# Patient Record
Sex: Male | Born: 1985 | Race: Black or African American | Hispanic: No | Marital: Single | Smoking: Never smoker
Health system: Southern US, Community
[De-identification: ages and names within clinical notes are randomized; demographics above are authoritative.]

---

## 2012-09-18 ENCOUNTER — Emergency Department (HOSPITAL_BASED_OUTPATIENT_CLINIC_OR_DEPARTMENT_OTHER)
Admission: EM | Admit: 2012-09-18 | Discharge: 2012-09-19 | Disposition: A | Discharge status: Home / Self Care | Payer: Self-pay | Attending: Emergency Medicine | Admitting: Emergency Medicine

## 2012-09-18 ENCOUNTER — Emergency Department (HOSPITAL_BASED_OUTPATIENT_CLINIC_OR_DEPARTMENT_OTHER): Payer: Self-pay

## 2012-09-18 ENCOUNTER — Encounter (HOSPITAL_BASED_OUTPATIENT_CLINIC_OR_DEPARTMENT_OTHER): Payer: Self-pay

## 2012-09-18 DIAGNOSIS — R0789 Other chest pain: Secondary | ICD-10-CM | POA: Insufficient documentation

## 2012-09-18 LAB — TROPONIN I: Troponin I: <0.3 ng/mL (ref <0.30)

## 2012-09-18 MED ORDER — ASPIRIN 81 MG PO CHEW
162.0000 mg | CHEWABLE_TABLET | Freq: Once | ORAL | Status: AC
  Administered 2012-09-18: 162 mg via ORAL
  Filled 2012-09-18: qty 2

## 2012-09-18 MED ORDER — ASPIRIN 81 MG PO CHEW: 81.0000 mg | CHEWABLE_TABLET | Freq: Every day | ORAL | Status: DC

## 2012-09-18 NOTE — ED Notes (Signed)
C/o central chest pressure x 2 days with palpitations

## 2012-09-18 NOTE — ED Provider Notes (Signed)
History     CSN: [id number]  Arrival date & time 09/18/12  2056   First MD Initiated Contact with Patient 09/18/12 2112      Chief Complaint  Patient presents with  . Chest Pain    (Consider location/radiation/quality/duration/timing/severity/associated sxs/prior treatment) HPI Comments: Pt with no medical hx, comes in with cc of chest pain. Pt has been having this chest heaviness, diffuse, and some discomfort starting at the neck for the past 3-5 days. The sx are intermittent, with no specific aggravating or relieving factors. Pt has no n/v/f/c/dysphagia/diophoresis/cough. Pt had 1 instance of palpitations, but he was asymptomatic. No family hx of premature CAD, dysrhythmias, and patient had no dizziness, or syncope this time, or anytime in the past. No illicit use.  Patient is a 26 y.o. male presenting with chest pain. The history is provided by the patient.  Chest Pain Pertinent negatives for primary symptoms include no shortness of breath, no cough and no abdominal pain.     History reviewed. No pertinent past medical history.  History reviewed. No pertinent past surgical history.  No family history on file.  History  Substance Use Topics  . Smoking status: Never Smoker   . Smokeless tobacco: Not on file  . Alcohol Use: Yes      Review of Systems  Constitutional: Negative for activity change and appetite change.  Respiratory: Positive for chest tightness. Negative for cough, choking and shortness of breath.   Cardiovascular: Positive for chest pain. Negative for leg swelling.  Gastrointestinal: Negative for abdominal pain.  Genitourinary: Negative for dysuria.    Allergies  Review of patient's allergies indicates no known allergies.  Home Medications  No current outpatient prescriptions on file.  BP 133/83  Pulse 72  Temp 99 F (37.2 C) (Oral)  Resp 16  Ht 6' (1.829 m)  Wt 175 lb (79.379 kg)  BMI 23.73 kg/m2  SpO2 100%  Physical Exam  Nursing  note and vitals reviewed. Constitutional: He is oriented to person, place, and time. He appears well-developed.  HENT:  Head: Normocephalic and atraumatic.  Eyes: Conjunctivae normal and EOM are normal. Pupils are equal, round, and reactive to light.  Neck: Normal range of motion. Neck supple.  Cardiovascular: Normal rate and regular rhythm.   Pulmonary/Chest: Effort normal and breath sounds normal.  Abdominal: Soft. Bowel sounds are normal. He exhibits no distension. There is no tenderness. There is no rebound and no guarding.  Neurological: He is alert and oriented to person, place, and time.  Skin: Skin is warm.    ED Course  Procedures (including critical care time)   Labs Reviewed  TROPONIN I   No results found.   No diagnosis found.    MDM   Date: 09/18/2012  Rate: 66  Rhythm: normal sinus rhythm  QRS Axis: normal  Intervals: normal  ST/T Wave abnormalities: normal  Conduction Disutrbances: none  Narrative Interpretation: unremarkable  Pt comes to ED with cc of chest discomfort. His EKG is not showing any interval abnormalities, no acute findings/ Hx not suggestive of cardiac etiology, and heavy investigation in the past hx and family hx is non contributory.  Plan is to get basic labs, EKG and CXR only. Dont think there is any esophagitis. His upper airway exam is benign and reassuring as well.        Derwood Kaplan, MD 09/18/12 2205

## 2014-01-03 IMAGING — CR DG CHEST 1V PORT
2 series · 2 of 2 positions shown · non-contrast
Comparison: None.

CLINICAL DATA: Central chest pressure and palpitations.

PORTABLE CHEST - 1 VIEW

[view not recorded (1 of 2)]
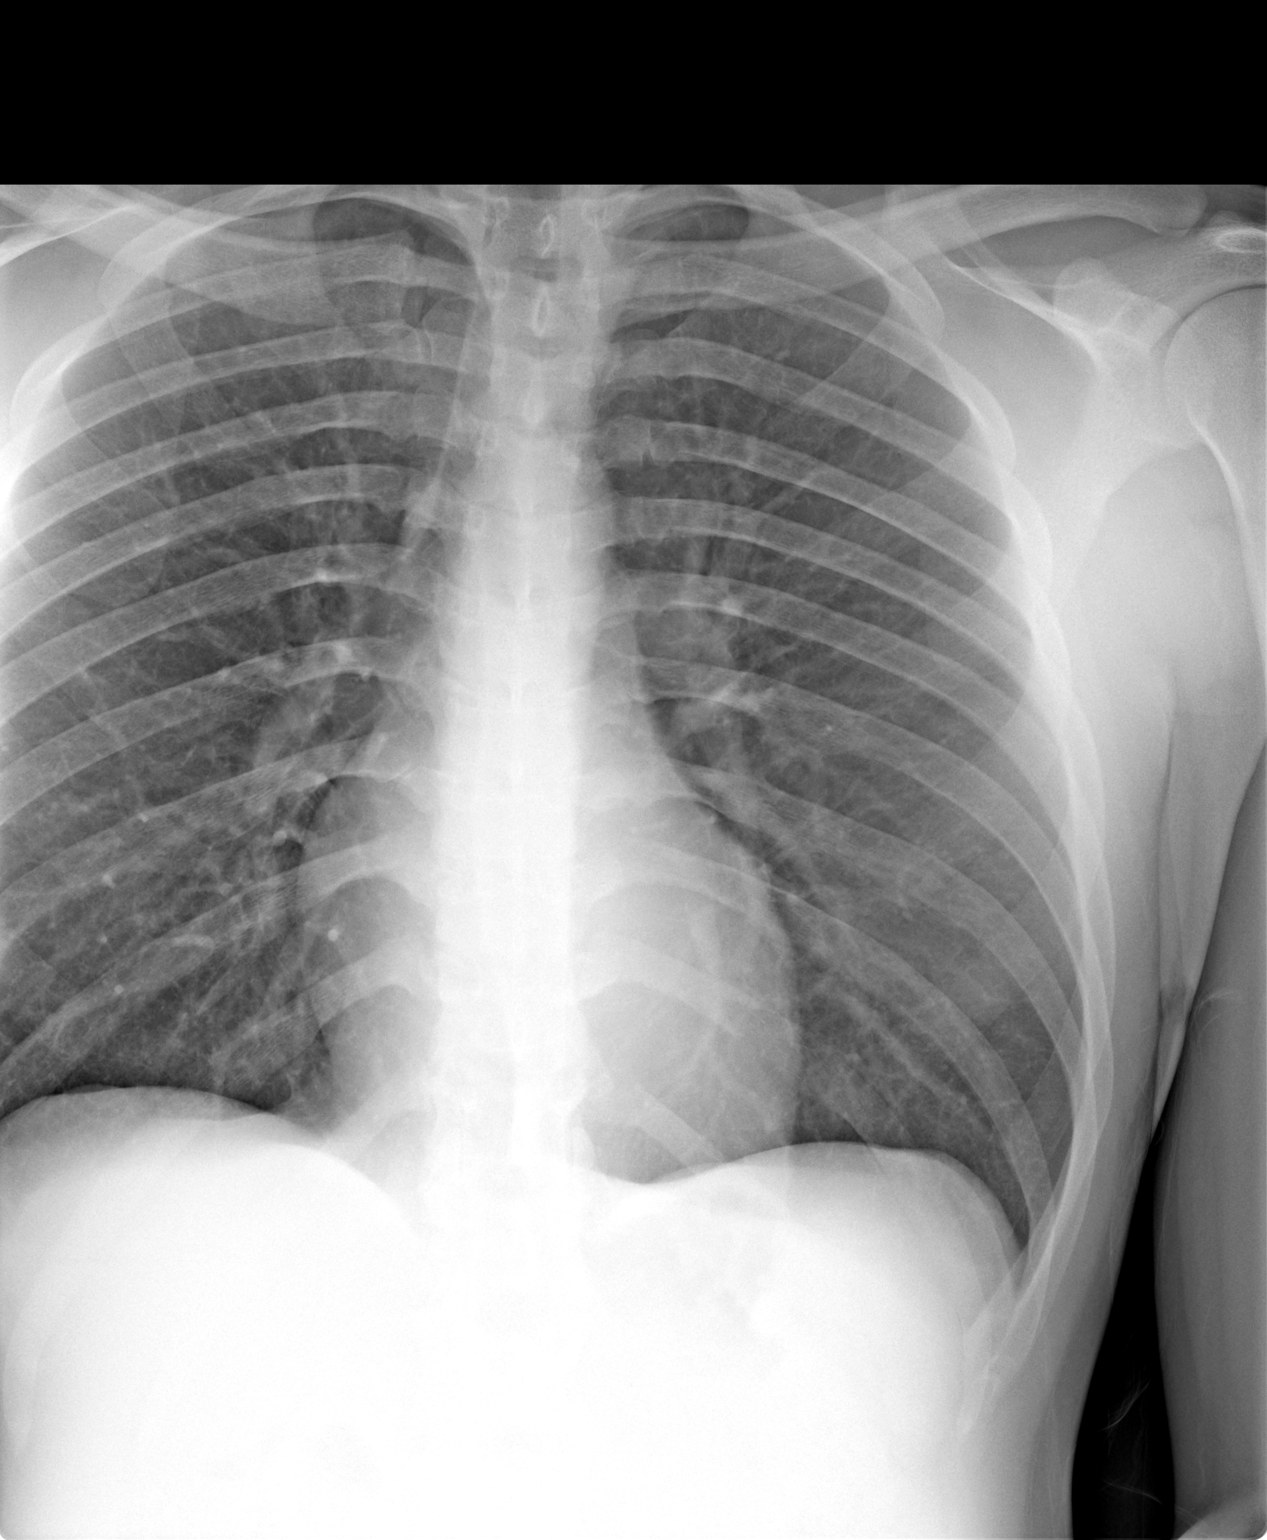

[view not recorded (2 of 2)]
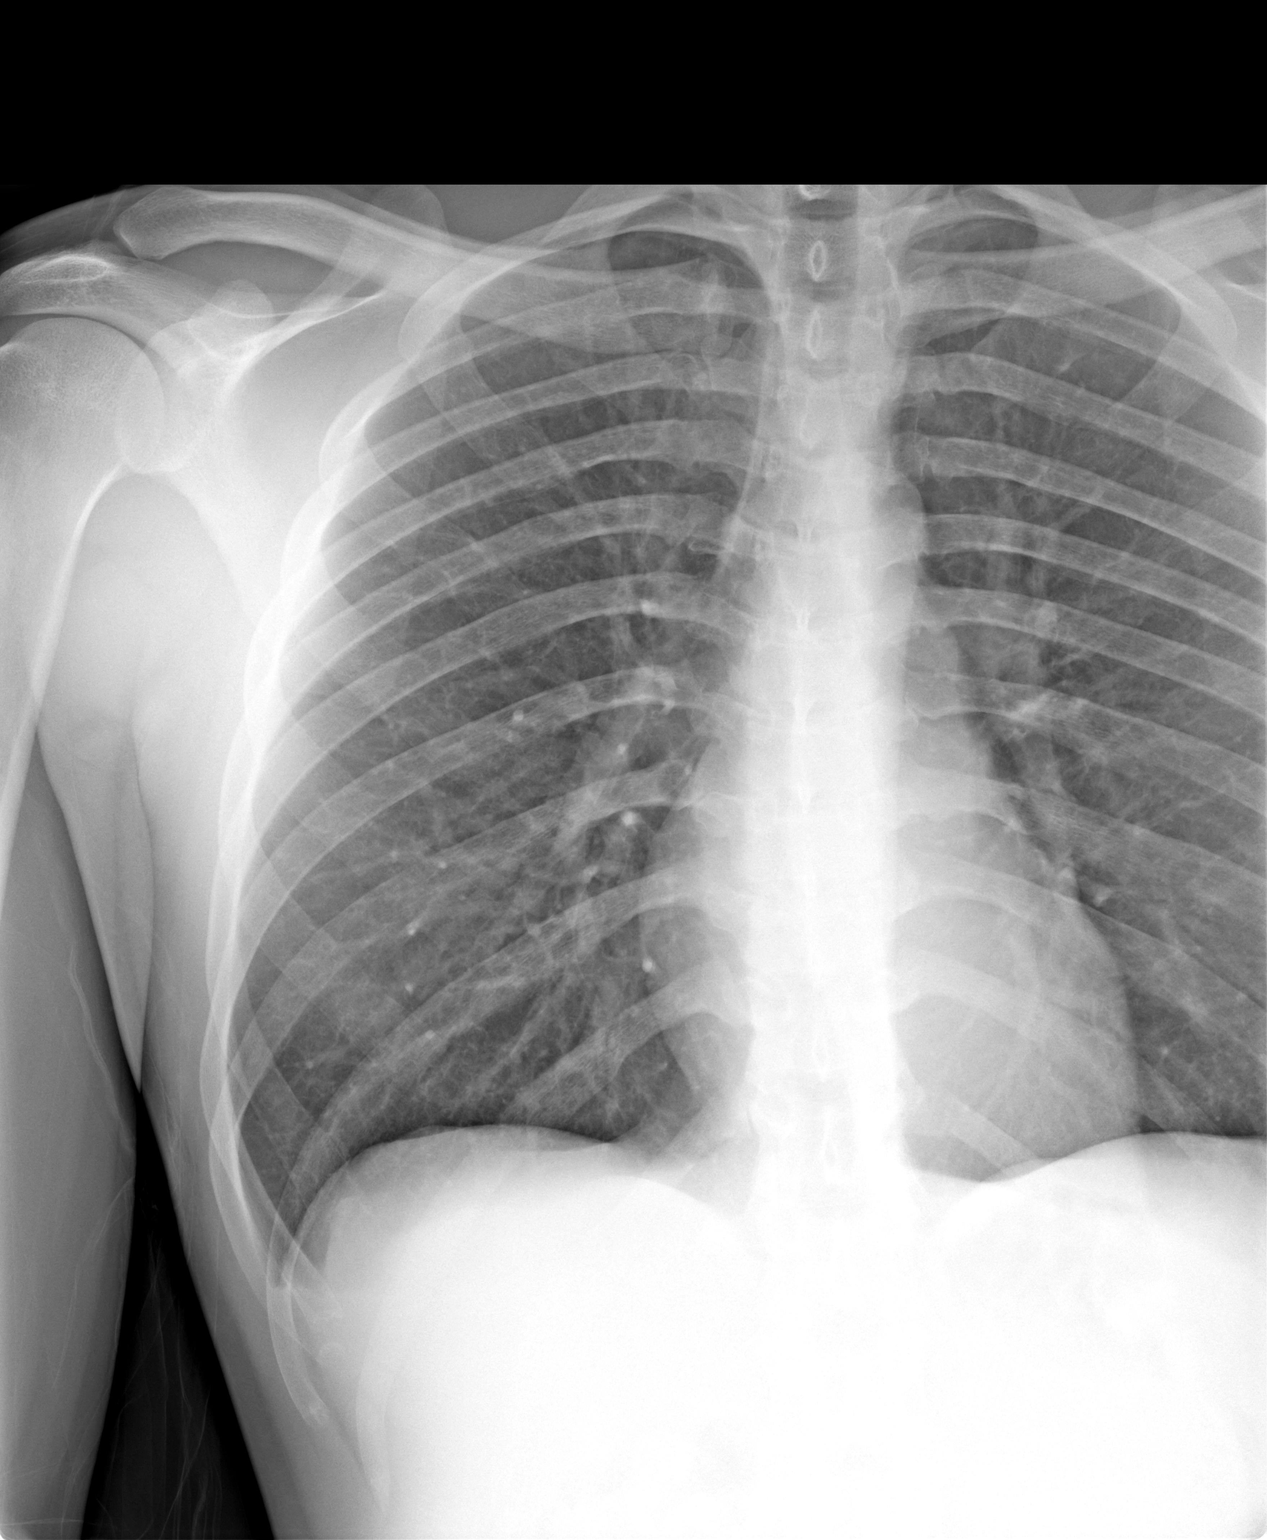

[2 of 2 positions shown; findings below may reference images not displayed]

FINDINGS: The lungs are well-aerated and clear.  There is no
evidence of focal opacification, pleural effusion or pneumothorax.

The cardiomediastinal silhouette is within normal limits.  No acute
osseous abnormalities are seen.
IMPRESSION: No acute cardiopulmonary process seen.

## 2015-02-16 ENCOUNTER — Emergency Department (HOSPITAL_BASED_OUTPATIENT_CLINIC_OR_DEPARTMENT_OTHER)
Admission: EM | Admit: 2015-02-16 | Discharge: 2015-02-16 | Disposition: A | Discharge status: Home / Self Care | Payer: BLUE CROSS/BLUE SHIELD | Attending: Emergency Medicine | Admitting: Emergency Medicine

## 2015-02-16 ENCOUNTER — Emergency Department (HOSPITAL_BASED_OUTPATIENT_CLINIC_OR_DEPARTMENT_OTHER): Payer: BLUE CROSS/BLUE SHIELD

## 2015-02-16 ENCOUNTER — Encounter (HOSPITAL_BASED_OUTPATIENT_CLINIC_OR_DEPARTMENT_OTHER): Payer: Self-pay | Admitting: *Deleted

## 2015-02-16 DIAGNOSIS — X58XXXA Exposure to other specified factors, initial encounter: Secondary | ICD-10-CM | POA: Diagnosis not present

## 2015-02-16 DIAGNOSIS — S99912A Unspecified injury of left ankle, initial encounter: Secondary | ICD-10-CM | POA: Diagnosis present

## 2015-02-16 DIAGNOSIS — S93402A Sprain of unspecified ligament of left ankle, initial encounter: Secondary | ICD-10-CM

## 2015-02-16 DIAGNOSIS — Y9302 Activity, running: Secondary | ICD-10-CM | POA: Insufficient documentation

## 2015-02-16 DIAGNOSIS — Y999 Unspecified external cause status: Secondary | ICD-10-CM | POA: Diagnosis not present

## 2015-02-16 DIAGNOSIS — Y929 Unspecified place or not applicable: Secondary | ICD-10-CM | POA: Diagnosis not present

## 2015-02-16 NOTE — ED Notes (Addendum)
Pt alert, NAD, calm, interactive, resps e/u, [facility] w/c[facility] describes as rolled ankle laterally while running, busses tables for work. To xray by w/c.

## 2015-02-16 NOTE — ED Notes (Signed)
[facility]

## 2015-02-16 NOTE — ED Provider Notes (Signed)
CSN: [id number]     Arrival date & time 02/16/15  2035 History   First MD Initiated Contact with Patient 02/16/15 2044     Chief Complaint  Patient presents with  . Ankle Pain     (Consider location/radiation/quality/duration/timing/severity/associated sxs/prior Treatment) HPI Comments: 29 year old male complaining of ankle pain after twisting his ankle on a branch while running outside around noon today. States he initially walked on it, however started to experience increased pain. Pain is not bad at rest, however significantly increases with pressure. States it appears slightly swollen on the outside of his ankle. Denies numbness or tingling. He has not tried any medications for his pain.  Patient is a 29 y.o. male presenting with ankle pain. The history is provided by the patient.  Ankle Pain   History reviewed. No pertinent past medical history. History reviewed. No pertinent past surgical history. History reviewed. No pertinent family history. History  Substance Use Topics  . Smoking status: Never Smoker   . Smokeless tobacco: Not on file  . Alcohol Use: Yes    Review of Systems  Constitutional: Negative.   HENT: Negative.   Respiratory: Negative.   Cardiovascular: Negative.   Musculoskeletal:       + L ankle pain and swelling.  Skin: Negative for color change.  Neurological: Negative for numbness.      Allergies  Review of patient's allergies indicates no known allergies.  Home Medications   Prior to Admission medications   Not on File   BP 145/76 mmHg  Pulse 97  Temp(Src) 99.1 F (37.3 C) (Oral)  Resp 18  Ht 6' (1.829 m)  Wt 195 lb (88.451 kg)  BMI 26.44 kg/m2  SpO2 97% Physical Exam  Constitutional: He is oriented to person, place, and time. He appears well-developed and well-nourished. No distress.  HENT:  Head: Normocephalic and atraumatic.  Eyes: Conjunctivae and EOM are normal.  Neck: Normal range of motion. Neck supple.  Cardiovascular:  Normal rate, regular rhythm and normal heart sounds.   Pulmonary/Chest: Effort normal and breath sounds normal.  Musculoskeletal:  L ankle TTP over AITFL with mild swelling. No deformity. Normal dorsiflexion and plantar flexion. Pain with eversion. Able to wiggle toes. PT/DP pulses. No tenderness at proximal fibula. Achilles tendon normal and intact.  Neurological: He is alert and oriented to person, place, and time.  Skin: Skin is warm and dry.  Psychiatric: He has a normal mood and affect. His behavior is normal.  Nursing note and vitals reviewed.   ED Course  Procedures (including critical care time) Labs Review Labs Reviewed - No data to display  Imaging Review Dg Ankle Complete Left  02/16/2015   CLINICAL DATA:  29 year old male with lateral ankle pain and swelling following twisting injury earlier tonight  EXAM: LEFT ANKLE COMPLETE - 3+ VIEW  COMPARISON:  None.  FINDINGS: There is no evidence of fracture, dislocation, or joint effusion. There is no evidence of arthropathy or other focal bone abnormality. Soft tissues are unremarkable.  IMPRESSION: Negative.   Electronically Signed   By: Malachy MoanHeath  McCullough M.D.   On: 02/16/2015 21:33     EKG Interpretation None      MDM   Final diagnoses:  Left ankle sprain, initial encounter   Neurovascularly intact. X-ray negative. Ace wrap applied. Crutches given. Advised RICE, NSAIDs. Stable for d/c. Return precautions given. Patient states understanding of treatment care plan and is agreeable.  Kathrynn SpeedRobyn M Cheryl Stabenow, PA-C 02/16/15 2139  Nelva Nayobert Beaton, MD 02/17/15 [phone]

## 2015-02-16 NOTE — ED Notes (Signed)
Pt reports twisting his ankle while running today.  No deformity, swelling noted.  nontender on palpation.

## 2015-02-16 NOTE — Discharge Instructions (Signed)
Rest, ice and elevate your ankle. Take ibuprofen, 600-800 mg every 6-8 hours as needed for pain.  Ankle Sprain An ankle sprain is an injury to the strong, fibrous tissues (ligaments) that hold the bones of your ankle joint together.  CAUSES An ankle sprain is usually caused by a fall or by twisting your ankle. Ankle sprains most commonly occur when you step on the outer edge of your foot, and your ankle turns inward. People who participate in sports are more prone to these types of injuries.  SYMPTOMS   Pain in your ankle. The pain may be present at rest or only when you are trying to stand or walk.  Swelling.  Bruising. Bruising may develop immediately or within 1 to 2 days after your injury.  Difficulty standing or walking, particularly when turning corners or changing directions. DIAGNOSIS  Your caregiver will ask you details about your injury and perform a physical exam of your ankle to determine if you have an ankle sprain. During the physical exam, your caregiver will press on and apply pressure to specific areas of your foot and ankle. Your caregiver will try to move your ankle in certain ways. An X-ray exam may be done to be sure a bone was not broken or a ligament did not separate from one of the bones in your ankle (avulsion fracture).  TREATMENT  Certain types of braces can help stabilize your ankle. Your caregiver can make a recommendation for this. Your caregiver may recommend the use of medicine for pain. If your sprain is severe, your caregiver may refer you to a surgeon who helps to restore function to parts of your skeletal system (orthopedist) or a physical therapist. HOME CARE INSTRUCTIONS   Apply ice to your injury for 1-2 days or as directed by your caregiver. Applying ice helps to reduce inflammation and pain.  Put ice in a plastic bag.  Place a towel between your skin and the bag.  Leave the ice on for 15-20 minutes at a time, every 2 hours while you are  awake.  Only take over-the-counter or prescription medicines for pain, discomfort, or fever as directed by your caregiver.  Elevate your injured ankle above the level of your heart as much as possible for 2-3 days.  If your caregiver recommends crutches, use them as instructed. Gradually put weight on the affected ankle. Continue to use crutches or a cane until you can walk without feeling pain in your ankle.  If you have a plaster splint, wear the splint as directed by your caregiver. Do not rest it on anything harder than a pillow for the first 24 hours. Do not put weight on it. Do not get it wet. You may take it off to take a shower or bath.  You may have been given an elastic bandage to wear around your ankle to provide support. If the elastic bandage is too tight (you have numbness or tingling in your foot or your foot becomes cold and blue), adjust the bandage to make it comfortable.  If you have an air splint, you may blow more air into it or let air out to make it more comfortable. You may take your splint off at night and before taking a shower or bath. Wiggle your toes in the splint several times per day to decrease swelling. SEEK MEDICAL CARE IF:   You have rapidly increasing bruising or swelling.  Your toes feel extremely cold or you lose feeling in your foot.  Your  pain is not relieved with medicine. SEEK IMMEDIATE MEDICAL CARE IF:  Your toes are numb or blue.  You have severe pain that is increasing. MAKE SURE YOU:   Understand these instructions.  Will watch your condition.  Will get help right away if you are not doing well or get worse. Document Released: 10/07/2005 Document Revised: 07/01/2012 Document Reviewed: 10/19/2011 [facility] Patient Information 2015 [place], [place]. This information is not intended to replace advice given to you by your health care provider. Make sure you discuss any questions you have with your health care provider.

## 2016-06-02 IMAGING — CR DG ANKLE COMPLETE 3+V*L*
3 series · 3 of 3 positions shown · non-contrast
Comparison: None.

CLINICAL DATA: 28-year-old male with lateral ankle pain and
swelling following twisting injury earlier tonight

EXAM:
LEFT ANKLE COMPLETE - 3+ VIEW

[t ankle joint ap left]
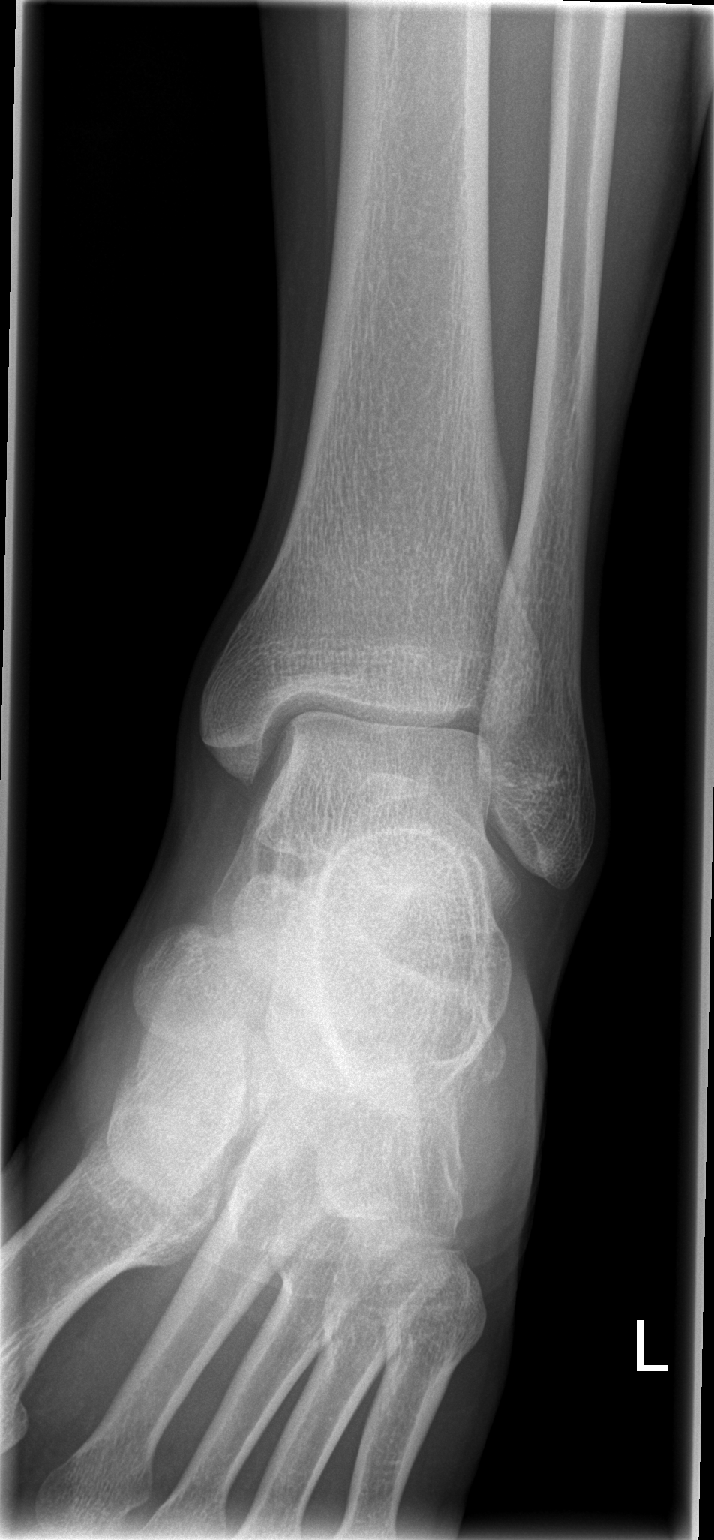

[t ankle joint oblique left]
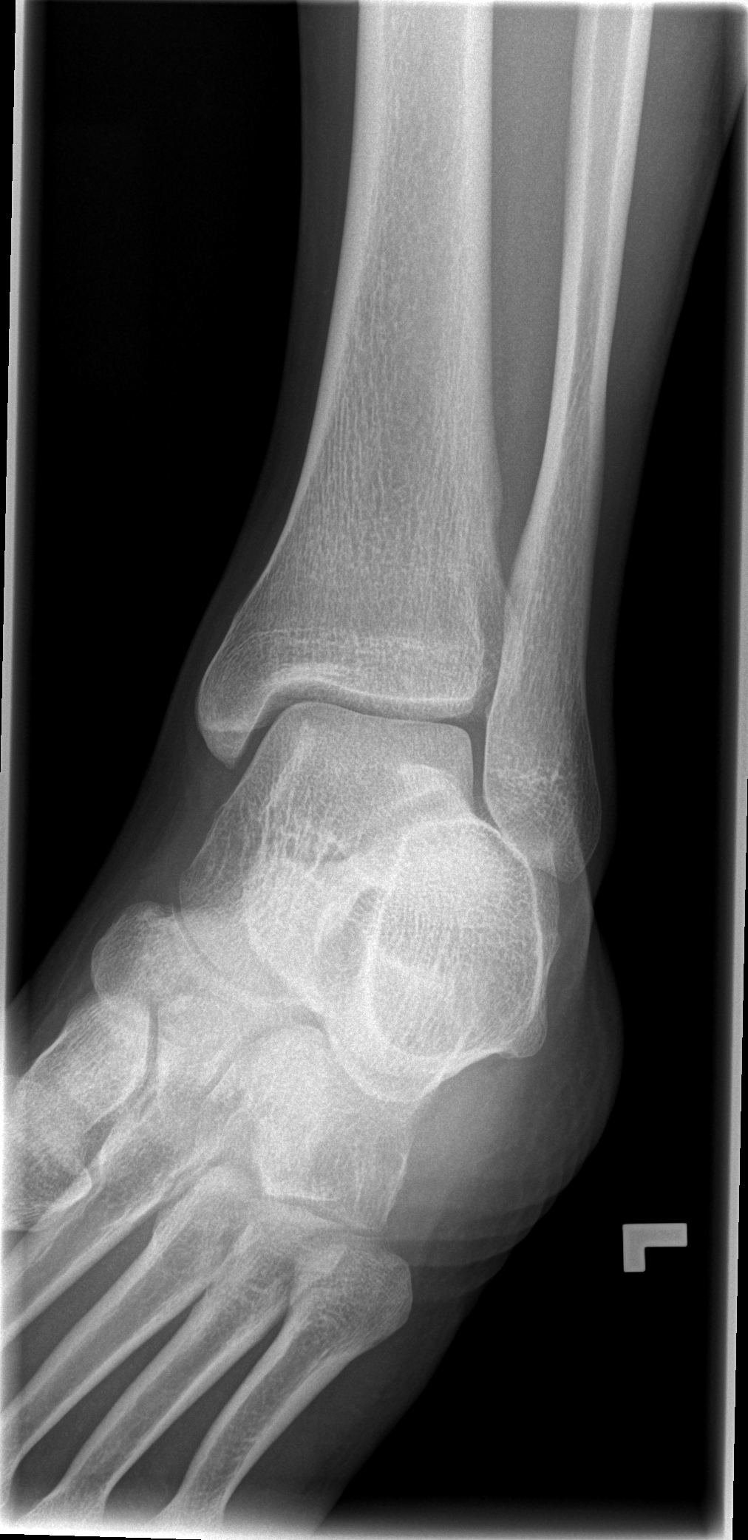

[t ankle joint lat left]
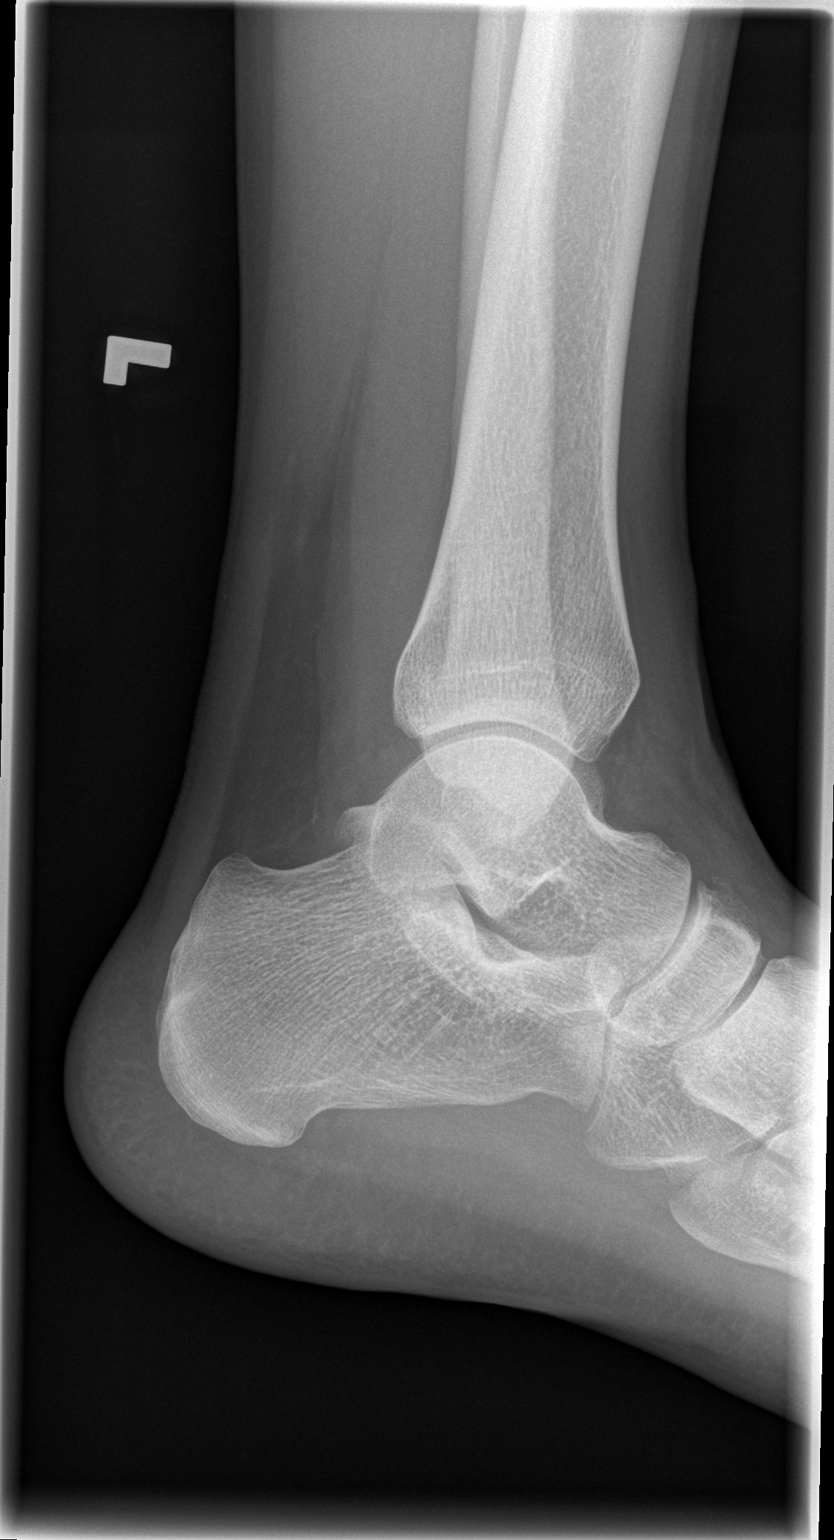

[3 of 3 positions shown; findings below may reference images not displayed]

FINDINGS: There is no evidence of fracture, dislocation, or joint effusion.
There is no evidence of arthropathy or other focal bone abnormality.
Soft tissues are unremarkable.
IMPRESSION: Negative.

## 2019-03-01 ENCOUNTER — Emergency Department (HOSPITAL_BASED_OUTPATIENT_CLINIC_OR_DEPARTMENT_OTHER)
Admission: EM | Admit: 2019-03-01 | Discharge: 2019-03-01 | Disposition: A | Discharge status: Home / Self Care | Payer: BLUE CROSS/BLUE SHIELD | Attending: Emergency Medicine | Admitting: Emergency Medicine

## 2019-03-01 ENCOUNTER — Encounter (HOSPITAL_BASED_OUTPATIENT_CLINIC_OR_DEPARTMENT_OTHER): Payer: Self-pay | Admitting: Emergency Medicine

## 2019-03-01 ENCOUNTER — Other Ambulatory Visit: Payer: Self-pay

## 2019-03-01 DIAGNOSIS — S29012A Strain of muscle and tendon of back wall of thorax, initial encounter: Secondary | ICD-10-CM | POA: Insufficient documentation

## 2019-03-01 DIAGNOSIS — Y9359 Activity, other involving other sports and athletics played individually: Secondary | ICD-10-CM | POA: Diagnosis not present

## 2019-03-01 DIAGNOSIS — Y999 Unspecified external cause status: Secondary | ICD-10-CM | POA: Insufficient documentation

## 2019-03-01 DIAGNOSIS — X500XXA Overexertion from strenuous movement or load, initial encounter: Secondary | ICD-10-CM | POA: Insufficient documentation

## 2019-03-01 DIAGNOSIS — K5900 Constipation, unspecified: Secondary | ICD-10-CM | POA: Insufficient documentation

## 2019-03-01 DIAGNOSIS — Y9239 Other specified sports and athletic area as the place of occurrence of the external cause: Secondary | ICD-10-CM | POA: Diagnosis not present

## 2019-03-01 DIAGNOSIS — K625 Hemorrhage of anus and rectum: Secondary | ICD-10-CM | POA: Diagnosis present

## 2019-03-01 MED ORDER — POLYETHYLENE GLYCOL 3350 17 GM/SCOOP PO POWD: 1.0000 | Freq: Every day | ORAL | 0 refills | Status: AC

## 2019-03-01 MED ORDER — HYDROCORTISONE ACETATE 25 MG RE SUPP: 25.0000 mg | Freq: Two times a day (BID) | RECTAL | 0 refills | Status: AC

## 2019-03-01 MED FILL — HEMMOREX-HC 25 MG SUPP: 25 | 6 days supply | Qty: 12 | Fill #0

## 2019-03-01 MED FILL — SM CLEARLAX POWDER: 17 | 30 days supply | Qty: 238 | Fill #0

## 2019-03-01 NOTE — ED Provider Notes (Signed)
[facility] EMERGENCY DEPARTMENT Provider Note   CSN: [id number] Arrival date & time: 03/01/19  [id number]    History   Chief Complaint Chief Complaint  Patient presents with  . Rectal Bleeding    HPI John Mcknight is a 33 y.o. male.     Patient is a 33 year old male with no significant past medical history presenting today with 2 separate complaints.  Patient states he initially came because he is having painless rectal bleeding with stools only for about 1 month.  Patient states that he normally has a bowel movement about 3 times a week when he does have a bowel movement they are large in caliber.  He has only seen blood in the toilet 1 time after he had a very large bowel movement but usually only sees it when he wipes which equals about 1 maybe 2 times a week.  His stool is normal color and there is no blood mixed with the stool.  He has no rectal pain and denies any abdominal pain.  He does not engage in anal sex that could cause irritation.  He denies any urinary symptoms.  He otherwise feels his normal self.  No family history of Crohn's disease or ulcerative colitis. Secondly patient states for about 2 to 3 months now he has had pain in the left upper back that started after lifting heavy weights while doing a lat exercise at the gym.  It does not hurt him every day but with certain movements he still will have some discomfort.  4 days ago he noticed he had some discomfort in the same area when he took a very deep breath.  He however denies any chest pain, shortness of breath, fever or cough.  The history is provided by the patient.  Rectal Bleeding    History reviewed. No pertinent past medical history.  There are no active problems to display for this patient.   History reviewed. No pertinent surgical history.      Home Medications    Prior to Admission medications   Medication Sig Start Date End Date Taking? Authorizing Provider  hydrocortisone  (ANUSOL-HC) 25 MG suppository Place 1 suppository (25 mg total) rectally 2 (two) times daily. 03/01/19   Gwyneth SproutPlunkett, Vicy Medico, MD  polyethylene glycol powder (MIRALAX) 17 GM/SCOOP powder Take 255 g by mouth daily. Take 1 scoop in 8 ounces of water daily to have more regular bowel movements. 03/01/19   Gwyneth SproutPlunkett, Jayli Fogleman, MD    Family History No family history on file.  Social History Social History   Tobacco Use  . Smoking status: Never Smoker  . Smokeless tobacco: Never Used  Substance Use Topics  . Alcohol use: Not Currently  . Drug use: No     Allergies   Patient has no known allergies.   Review of Systems Review of Systems  Gastrointestinal: Positive for hematochezia.  All other systems reviewed and are negative.    Physical Exam Updated Vital Signs BP (!) 159/101 (BP Location: Right Arm)   Pulse 96   Temp 98.7 F (37.1 C) (Oral)   Resp 18   Ht 6' (1.829 m)   Wt 95.3 kg   SpO2 100%   BMI 28.48 kg/m   Physical Exam Vitals signs and nursing note reviewed.  Constitutional:      General: He is not in acute distress.    Appearance: He is well-developed.  HENT:     Head: Normocephalic and atraumatic.  Eyes:     Conjunctiva/sclera:  Conjunctivae normal.     Pupils: Pupils are equal, round, and reactive to light.  Neck:     Musculoskeletal: Normal range of motion and neck supple.  Cardiovascular:     Rate and Rhythm: Normal rate and regular rhythm.     Heart sounds: No murmur.  Pulmonary:     Effort: Pulmonary effort is normal. No respiratory distress.     Breath sounds: Normal breath sounds. No wheezing or rales.  Abdominal:     General: There is no distension.     Palpations: Abdomen is soft.     Tenderness: There is no abdominal tenderness. There is no guarding or rebound.  Genitourinary:    Rectum: Normal.     Comments: No evidence of anal fissures or external hemorrhoids Musculoskeletal: Normal range of motion.        General: Tenderness present.        Back:  Skin:    General: Skin is warm and dry.     Findings: No erythema or rash.  Neurological:     General: No focal deficit present.     Mental Status: He is alert and oriented to person, place, and time. Mental status is at baseline.  Psychiatric:        Mood and Affect: Mood normal.        Behavior: Behavior normal.        Thought Content: Thought content normal.      ED Treatments / Results  Labs (all labs ordered are listed, but only abnormal results are displayed) Labs Reviewed - No data to display  EKG None  Radiology No results found.  Procedures Procedures (including critical care time)  Medications Ordered in ED Medications - No data to display   Initial Impression / Assessment and Plan / ED Course  I have reviewed the triage vital signs and the nursing notes.  Pertinent labs & imaging results that were available during my care of the patient were reviewed by me and considered in my medical decision making (see chart for details).        Patient presenting today with 2 separate complaints.  Initially was complaints of back pain which seems to be musculoskeletal strain from heavy lifting when he was doing exercises at the gym.  Recommended supportive care and patient describes the issues as minimal.  There are no neurologic complaints or weakness concerning for central spine involvement. Secondly patient for the last 1 month has been having painless rectal bleeding only with bowel movements.  He is only having 2-3 stools a week and has large caliber.  The blood is usually only there with wiping and is not mixed with the stool.  Stool is normal color.  Suspect most likely internal hemorrhoids as he has no evidence of external hemorrhoids or fissures.  He has no abdominal pain.  We will do Anusol suppositories and MiraLAX.  Patient was referred to GI if his symptoms do not improve with these therapies.  Final Clinical Impressions(s) / ED Diagnoses   Final  diagnoses:  Strain of thoracic paraspinal muscles excluding T1 and T2 levels, initial encounter  Rectal bleeding  Constipation, unspecified constipation type    ED Discharge Orders         Ordered    hydrocortisone (ANUSOL-HC) 25 MG suppository  2 times daily     03/01/19 1043    polyethylene glycol powder (MIRALAX) 17 GM/SCOOP powder  Daily     03/01/19 1043  Gwyneth Sprout, MD 03/01/19 1056

## 2019-03-01 NOTE — ED Triage Notes (Signed)
Pt reports dark red blood in stool for months; sts sometimes he only notices it when he wipes; denies abd pain; also reports intermittent LT upper back pain with movement
